# Patient Record
Sex: Female | Born: 1970 | Race: Black or African American | Hispanic: No | Marital: Single
Health system: Southern US, Community
[De-identification: ages and names within clinical notes are randomized; demographics above are authoritative.]

---

## 1998-12-20 ENCOUNTER — Encounter: Payer: Self-pay | Admitting: Family Medicine

## 1998-12-20 ENCOUNTER — Encounter: Admission: RE | Admit: 1998-12-20 | Discharge: 1998-12-20 | Payer: Self-pay | Admitting: Family Medicine

## 1999-01-12 ENCOUNTER — Other Ambulatory Visit: Admission: RE | Admit: 1999-01-12 | Discharge: 1999-01-12 | Payer: Self-pay | Admitting: General Surgery

## 1999-02-05 ENCOUNTER — Encounter: Admission: RE | Admit: 1999-02-05 | Discharge: 1999-02-05 | Payer: Self-pay | Admitting: General Surgery

## 1999-02-05 ENCOUNTER — Encounter: Payer: Self-pay | Admitting: General Surgery

## 1999-03-14 ENCOUNTER — Encounter: Payer: Self-pay | Admitting: General Surgery

## 1999-03-14 ENCOUNTER — Encounter (INDEPENDENT_AMBULATORY_CARE_PROVIDER_SITE_OTHER): Payer: Self-pay | Admitting: Specialist

## 1999-03-14 ENCOUNTER — Ambulatory Visit (HOSPITAL_COMMUNITY): Admission: RE | Admit: 1999-03-14 | Discharge: 1999-03-14 | Payer: Self-pay | Admitting: General Surgery

## 1999-09-12 ENCOUNTER — Encounter (INDEPENDENT_AMBULATORY_CARE_PROVIDER_SITE_OTHER): Payer: Self-pay | Admitting: Specialist

## 1999-09-12 ENCOUNTER — Ambulatory Visit (HOSPITAL_COMMUNITY): Admission: RE | Admit: 1999-09-12 | Discharge: 1999-09-13 | Payer: Self-pay | Admitting: Otolaryngology

## 2000-09-30 ENCOUNTER — Other Ambulatory Visit: Admission: RE | Admit: 2000-09-30 | Discharge: 2000-09-30 | Payer: Self-pay | Admitting: *Deleted

## 2001-10-19 ENCOUNTER — Other Ambulatory Visit: Admission: RE | Admit: 2001-10-19 | Discharge: 2001-10-19 | Payer: Self-pay | Admitting: *Deleted

## 2002-08-13 ENCOUNTER — Encounter: Payer: Self-pay | Admitting: Family Medicine

## 2002-08-13 ENCOUNTER — Encounter: Admission: RE | Admit: 2002-08-13 | Discharge: 2002-08-13 | Payer: Self-pay | Admitting: Family Medicine

## 2002-11-02 ENCOUNTER — Other Ambulatory Visit: Admission: RE | Admit: 2002-11-02 | Discharge: 2002-11-02 | Payer: Self-pay | Admitting: *Deleted

## 2003-03-29 ENCOUNTER — Observation Stay (HOSPITAL_COMMUNITY): Admission: AD | Admit: 2003-03-29 | Discharge: 2003-03-29 | Payer: Self-pay | Admitting: *Deleted

## 2003-05-30 ENCOUNTER — Inpatient Hospital Stay (HOSPITAL_COMMUNITY): Admission: AD | Admit: 2003-05-30 | Discharge: 2003-05-30 | Payer: Self-pay | Admitting: Obstetrics and Gynecology

## 2003-11-10 ENCOUNTER — Inpatient Hospital Stay (HOSPITAL_COMMUNITY): Admission: AD | Admit: 2003-11-10 | Discharge: 2003-11-13 | Payer: Self-pay | Admitting: Obstetrics & Gynecology

## 2003-11-12 ENCOUNTER — Encounter (INDEPENDENT_AMBULATORY_CARE_PROVIDER_SITE_OTHER): Payer: Self-pay | Admitting: *Deleted

## 2003-12-29 ENCOUNTER — Other Ambulatory Visit: Admission: RE | Admit: 2003-12-29 | Discharge: 2003-12-29 | Payer: Self-pay | Admitting: Obstetrics and Gynecology

## 2004-05-24 ENCOUNTER — Inpatient Hospital Stay (HOSPITAL_COMMUNITY): Admission: EM | Admit: 2004-05-24 | Discharge: 2004-05-27 | Payer: Self-pay

## 2004-06-06 ENCOUNTER — Ambulatory Visit (HOSPITAL_COMMUNITY): Admission: RE | Admit: 2004-06-06 | Discharge: 2004-06-07 | Payer: Self-pay | Admitting: Otolaryngology

## 2005-04-03 ENCOUNTER — Other Ambulatory Visit: Admission: RE | Admit: 2005-04-03 | Discharge: 2005-04-03 | Payer: Self-pay | Admitting: Obstetrics and Gynecology

## 2008-06-08 ENCOUNTER — Encounter: Admission: RE | Admit: 2008-06-08 | Discharge: 2008-06-08 | Payer: Self-pay | Admitting: Family Medicine

## 2010-07-13 NOTE — Op Note (Signed)
NAME:  Ann Olsen, Ann Olsen                         ACCOUNT NO.:  [id number]   MEDICAL RECORD NO.:  [id number]                   PATIENT TYPE:  INP   LOCATION:  9304                                 FACILITY:  [facility]   PHYSICIAN:  Michelle L. Vincente Poli, M.D.            DATE OF BIRTH:  [birth year]   DATE OF PROCEDURE:  DATE OF DISCHARGE:  11/13/2003                                 OPERATIVE REPORT   PREOPERATIVE DIAGNOSES:  Postpartum and desires permanent sterilization.   POSTOPERATIVE DIAGNOSES:  Postpartum and desires permanent sterilization.   PROCEDURE:  Modified Pomeroy bilateral tubal ligation.   SURGEON:  Michelle L. Vincente Poli, M.D.   ANESTHESIA:  Epidural.   ESTIMATED BLOOD LOSS:  None.   FINDINGS:  Normal fimbriated fallopian tubes.   DESCRIPTION OF PROCEDURE:  The patient was taken to the operating room where  epidural was dosed by anesthesia and found to be adequate.  She was then  prepped and draped in the usual sterile fashion.  Local was placed in the  umbilicus, and a small transverse incision was made in the umbilicus.  The  fascia was then opened easily.  We used the [employer].  We then used  [employer] retractors, identified the fallopian tube on the right side,  followed it to its fimbriated end.  We picked it up using a Babcock clamp in  the mid-portion.  A 3-cm knuckle was tied off using plain gut suture x2, and  that knuckle was excised using Metzenbaum scissors.  In a similar fashion on  the left side, the fallopian tube was directly visualized and followed to  its fimbriated end.  It was picked up in the mid-portion using a Babcock  clamp, and a 3-cm knuckle was tied off using plain gut suture x2.  That  knuckle was excised. Hemostasis was noted.  Each fallopian tube segment was  sent in a separate specimen container.  At that point, we then closed the  fascia using a continuous running stitch using 0 Vicryl, and then closed the  skin using a subcuticular stitch  using 3-0 Vicryl.  All sponge, lap, and  instrument counts were correct x2.  The patient tolerated the procedure well  and went to the recovery room in stable condition.      MLG/MEDQ  D:  11/12/2003  T:  11/13/2003  Job:  [id number]

## 2010-07-13 NOTE — Consult Note (Signed)
NAME:  Ann Olsen, Ann Olsen               ACCOUNT NO.:  [id number]   MEDICAL RECORD NO.:  [id number]          PATIENT TYPE:  INP   LOCATION:  2302                         FACILITY:  [facility]   PHYSICIAN:  Zola Button T. Lazarus Salines, M.D. DATE OF BIRTH:  June 23, 1970   DATE OF CONSULTATION:  05/24/2004  DATE OF DISCHARGE:                                   CONSULTATION   CHIEF COMPLAINT:  Facial trauma.   HISTORY OF PRESENT ILLNESS:  This  40 year old black female was cleaning the  attic earlier today, took a misstep and apparently fell between the rafters  through the sheet rock approximately 15 feet down into the garage. She  struck some stored items and eventually wound up on the floor. The  suggestion was that she in fact lost consciousness at the scene. She  attempted to get in the house by banging on the door and finally her husband  heard her. She was noted to be bleeding from the left nose at the scene. She  was brought to the emergency room with complaints of headache, pain in the  left face, pain in the left arm. She has undergone significant evaluation  with the emergency room doctors and with the trauma surgeons. Specific to  the ENT inquiry, she has a fracture of the left zygoma including a depressed  fracture of the arch and a comminuted displaced sagittal fracture of the  lateral orbital rim and wall. There appears to be some bony impingement on  the orbital apex but not specifically impinging on the optic nerve or  causing extraocular muscle dysfunction. She reports decent vision in both  eyes. She has mild pain with range of motion. She also has some pain  attempting to open and shut her mouth but feels like her teeth fit together  normally. She denies numbness of her lip. She had some bleeding from the  nose which has spontaneously stopped. She feels like she is oriented at the  present time. No hearing difficulty. No neck pain. No change in voice. She  is not drooling or bleeding from the  mouth.   PHYSICAL EXAMINATION:  GENERAL:  This is a quiet adult black female.  Generally, she seems to hold her own in conversation but makes an occasional  comment that sounds like she may be still slightly confused.  HEENT:  She has a superficial scrape over the left cheek. She has ecchymotic  swelling around the left orbit. The lids are swollen shut but relatively  easily open without great tension. She has mild proptosis of the left eye.  She has a large subconjunctival hemorrhage on the lateral conjunctive.  According Dr. Allen Norris, the tension of the globe feels intact. Pupils  are equal, round and reactive to light. Cranial nerves intact with the  possible exception of slight reduced upward gaze left eye only. Ear canals  are clear with normal aerated drums and no hemotympanum. Anterior nose has  some bloody crusting in the left side and has leftward septal deviation  which is probably old. Oral cavity is moist with teeth in good repair and  mild trismus will with approximate 1.5 cm inter incisal opening anteriorly.  Oropharynx is clear. Could not examine nasopharynx or hypopharynx.  NECK: Without adenopathy or tenderness. On palpating the bony facial  contours, she is tender along the infraorbital rim but no obvious step-offs.  She is very tender along the lateral canthus and lateral orbital rim region.  She has depression of the left zygomatic arch. No other bony facial  tenderness.   IMPRESSION:  Left zygoma fracture including a depressed fracture of the left  arch with impingement on the temporalis muscle and coronoid process. A  severe comminuted almost sagittal split type fracture of the lateral orbital  wall with medial displacement approaching the orbital apex and narrowing of  that structure but without apparent optic nerve impingement or extraocular  muscle dysfunction. She does have mild trismus. She has a lateral  subconjunctival hemorrhage.   PLAN:  Dr. Allen Norris, ophthalmologist, does not feel the eye is acutely  compromised. Therefore, we will wait for her mental status to clear. I will  recommend ice, elevation, frequent visual checks, and amoxicillin or similar  to prevent infection from the sinus fractures, and no nose blowing for two  full weeks. She will need an open reduction internal fixation of the zygoma  next week with a special attention to the lateral orbital wall and the  zygomatic arch. I have discussed this with the patient. I will follow her  along in the hospital. Dr. Allen Norris would like her on moxifloxacin eye  drops which I have ordered. consultation on Maxie Debose  [phone] Dr. will be dictated on the March13, placed my office, Dr. Fernande Bras copy to the trauma service copy to the charts copy Dr. Read Drivers  thanks      KTW/MEDQ  D:  05/24/2004  T:  05/24/2004  Job:  [id number]   cc:   Allen Norris, M.D.  [address], [address]  [place]  [place] [address]  Fax: [phone]   Paula Libra, MD  Fax: [phone]

## 2010-07-13 NOTE — Discharge Summary (Signed)
NAME:  Ann Olsen, Ann Olsen               ACCOUNT NO.:  [id number]   MEDICAL RECORD NO.:  [id number]          PATIENT TYPE:  INP   LOCATION:  5004                         FACILITY:  [facility]   PHYSICIAN:  Gabrielle Dare. Janee Morn, M.D.DATE OF BIRTH:  Mar 09, 1970   DATE OF ADMISSION:  05/24/2004  DATE OF DISCHARGE:  05/27/2004                                 DISCHARGE SUMMARY   ADMITTING TRAUMA SURGEON:  Violeta Gelinas, M.D.   CONSULTANTS:  Zola Button T. Lazarus Salines, M.D. ENT, Stefani Dama, M.D. of  neurosurgery,  and Allen Norris, M.D. of ophthalmology.   DISCHARGE DIAGNOSES:  1.  Status post fall through an attic floor.  2.  Closed head injury with small subdural hematoma, small right posterior      parietal small contusion.  3.  Multiple left facial fractures of the orbit, maxillary sinus, and      zygoma.  4.  Left eye periorbital swelling.   BRIEF HISTORY ON ADMISSION:  This is a 40 year old black female who was  trying to retrieve some boxes from her attic when she apparently fell  through the attic flooring into the garage below. She fell onto the concrete  floor. There was loss of consciousness, brief in duration. She presented  complaining of left facial, head, and shoulder pain. She was alert and  oriented and had a Glasgow coma scale of 15 on admission, but she was  amnesic to the event. Workup at this time including CT scan of the head  showed a small left subdural hematoma and right posterior parietal small  contusion. CT scan of the cervical spine was negative for acute fracture or  other abnormality. Maxillofacial CT scan showed left orbital maxillary sinus  and zygoma comminuted fractures, and left humerus x-ray was negative for  fracture. Chest x-ray was questionable for a small left pulmonary contusion.   The patient was admitted for observation and pain control. She was seen in  consultation by Dr. Danielle Dess neurosurgery. She again was monitored in the ICU  and underwent follow-up CT  scan the day following admission which showed  stable small subdural. No formal neurosurgical follow-up was indicated. She  underwent evaluation by Dr. Lazarus Salines for multiple facial fractures and it was  recommended she be maintained on amoxicillin for 10 days for prophylaxis.  She also underwent evaluation by Dr. Laurell Josephs for possible left eye injury.  Again, she did have left orbital fractures but extraocular movements  appeared intact and there appeared to be no injury itself to the eye. She  was covered with antibiotic eye drops and was mobilized. She was doing well,  ambulating, and tolerating regular diet, and was able to be discharged home  in stable, improved condition 05/27/2004 with her family.   DISCHARGE MEDICATIONS:  1.  Percocet 1-2 p.o. q. 4-6 hours p.r.n. pain.  2.  Cipro eye drops 1 drop OS 4 times daily until all taken.   FOLLOWUP:  She is to follow up with Dr. Lazarus Salines next week. Follow up with  trauma clinic as needed. Follow up with Dr. Dione Booze as needed.  SR/MEDQ  D:  08/03/2004  T:  08/03/2004  Job:  [id number]

## 2010-07-13 NOTE — Op Note (Signed)
[place]  Patient:    Ann Olsen, Ann Olsen                        MRN: [id number] Proc. Date: 09/12/99 Adm. Date:  [id number] Disc. Date: [id number] Attending:  Serena Colonel H CC:         Adolph Pollack, M.D.             Doreatha Lew, M.D.             Gretta Arab Valentina Lucks, M.D.                           Operative Report  PREOPERATIVE DIAGNOSIS:  Right thyroid mass, left neck mass.  POSTOPERATIVE DIAGNOSIS:  Right thyroid mass, left neck mass, suspicious for vagal schwannoma.  OPERATION PERFORMED: 1. Right hemithyroidectomy. 2. Excision of left vagal schwannoma.  SURGEON:  Jefry H. Pollyann Kennedy, M.D.  ASSISTANT:  Gloris Manchester. Lazarus Salines, M.D.  ANESTHESIA:  General endotracheal.  COMPLICATIONS:  None.  FINDINGS:  A 2 cm cystic dark mass involving the right superior pole of the thyroid gland, a smooth encapsulated firm mass that seemed to be within the sheath of the left vagal nerve about 1 cm above the clavicle.  FROZEN SECTION DIAGNOSIS: 1. 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 2. Left neck mass consistent with schwannoma.  REFERRING PHYSICIAN:  Adolph Pollack, M.D.  The patient tolerated the procedure well, was awakened, extubated and transferred to recovery in stable condition.  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  The risks, benefits, alternatives and complications of the procedure were explained to the patient, who seemed to understand and agreed to surgery.  DESCRIPTION OF PROCEDURE:  The patient was taken to the operating room and placed on the operating table in the supine position.  Following induction of general endotracheal anesthesia, the neck was prepped and draped in a standard fashion.  A shoulder  roll was placed beneath the shoulder blades to hyperextend the neck.  A transverse incision was outlined with a marking pen on the low anterior neck and was incised with a #10 scalpel.  Electrocautery was then used to dissect through the fascia and the platysma muscle.  A subplatysmal flap was developed superiorly up to the thyroid cartilage and inferiorly to the clavicle.  A thyroid retractor was then used to retract the flaps.  1 - Right hemithyroidectomy.  The midline fascia was divided and careful dissection was accomplished around the anterolateral surface of the right lobe of the thyroid.  The superior pole was reflected inferiorly carefully ligating the vascular pedicle superiorly.  Careful dissection staying right on the capsules and accomplished around the entire lobe of the gland.  What appeared to be a parathyroid gland inferiorly was dissected free, keeping its vascular supply intact.  Careful dissection around the posterior aspect of the gland revealed the ligament of Allyson Sabal which was sharply divided up along the face of the trachea.  The recurrent laryngeal nerve was identified and the fibrofatty tissue deep to this in the tracheoesophageal groove.  This was left unmolested.  The isthmus was divided using electrocautery and the right lobe was sent for pathologic evaluation with frozen section.  Silk ties and bipolar  electrocautery were used as needed for hemostasis of the thyroid bed on the right side. Palpation of the left thyroid gland did not reveal any palpable masses.  2 - Excision of left vagal schwannoma.  After the right thyroid lobe was removed, the attention was then turned towards the left side of the neck.  The isthmus was followed over to the left thyroid lobe which was free of any palpable masses.  Dissection was then accomplished inferior to the thyroid lobe, which is where the mass was seen on preop CT scan.  Careful dissection revealed the inferior thyroid  artery on the left side that was left intact. The dissection was lateral to the inferior thyroid artery and medial and just superficial to the carotid artery.  Within the fibrofatty tissue deep to the sternocleidomastoid muscle the mass became evident and was palpated.  Careful dissection through fibrous tissue was accomplished using electrocautery and sharp dissection and the capsule of the mass was identified.  The entire mass was mobilized around the circumference using sharp dissection.  It seemed to have an attachment superiorly and inferiorly and careful dissection of these attachments revealed what was felt to be the vagus nerve.  On the posterior aspect of the mass, there were three distinct 1 to 2 mm nerve fibers that were clearly visible.  These were sharply dissected free of the underlying mass. The mass was completely delivered from the capsule and was smooth and ovoid in shape.  The remainder of the nerve was kept intact.  The mass was sent for frozen section evaluation.  The wound was irrigated with saline and hemostasis was completed using silk ties and electrocautery as needed.  The wound was closed in layers using 3-0 chromic on the strap muscles fascia and the platysma.  The skin was closed with a running 5-0 nylon suture.  A #7 [place] round J-P drain was left in the wound and exited through the left side of the incision.  The patient was then awakened from anesthesia, extubated and transferred to recovery in stable condition. DD:  09/12/99 TD:  09/14/99 Job: [id number]

## 2010-07-13 NOTE — Op Note (Signed)
NAME:  Ann Olsen, Ann Olsen               ACCOUNT NO.:  [id number]   MEDICAL RECORD NO.:  [id number]          PATIENT TYPE:  OIB   LOCATION:  5707                         FACILITY:  [facility]   PHYSICIAN:  Zola Button T. Lazarus Salines, M.D. DATE OF BIRTH:  26-Dec-1970   DATE OF PROCEDURE:  06/06/2004  DATE OF DISCHARGE:                                 OPERATIVE REPORT   PREOPERATIVE DIAGNOSIS:  Depressed left zygoma fracture with depressed arch  and comminuted lateral orbital wall fracture.   POSTOPERATIVE DIAGNOSIS:  Depressed left zygoma fracture with depressed arch  and comminuted lateral orbital wall fracture.   PROCEDURE PERFORMED:  Open reduction and internal fixation, left zygomatic  fracture, with orbital wall exploration and Frost stitch.   SURGEON:  Gloris Manchester. Lazarus Salines, M.D.   ANESTHESIA:  General orotracheal.   BLOOD LOSS:  Minimal.   COMPLICATIONS:  None.   FINDINGS:  A badly depressed zygomatic arch fracture with some mechanical  trismus readily improved with forcible mechanical distraction of the jaw.  A  mobile zygoma after elevation with a joker elevator.  comminuted lateral  orbital wall fracture with several pieces impinging medially towards the  orbital apex.  No step-off or displacement of the infraorbital rim.   PROCEDURE:  With the patient in a comfortable supine position, general  orotracheal anesthesia was induced without difficulty.  At an appropriate  level, the patient was placed in a slight sitting position.  The right eye  was taped shut.  The left eye was lubricated and then closed with a  6-0  Ethilon Frost stitch in the standard fashion.  1% Xylocaine with 1:100,000  epinephrine, 8 mL total, was infiltrated into the lower lid and into the  lateral brow for surgical hemostasis.  Several minutes were allowed for this  to take effect.  A sterile preparation and draping of the eye region and  midface was accomplished.   The x-rays were reviewed in the operating room.   The zygoma was palpated with the findings as described above.  Note that at  the time of intubation, she had limited jaw motion.  I was able to grasp the  mandible and with gentle traction, loosen some obvious fibrous adhesions and  allow her to have a more full range of motion, allowing an intubation.  After palpating the fractures, a lateral brow incision was sharply executed  approximately 3 cm in length and carried down through skin, orbicularis  oculi muscle, and to the periosteum of the orbital rim.  This was done  directly over the fracture line.  The periosteum was elevated back towards  the temporal fossa and into the orbit across the fracture line.  The  findings were as described above.   A joker elevator was dissected back behind the body of the zygoma and with  the anterolateral pressure, the zygoma was mobilized and the arch was  elevated and was stable in its elevated position.  There was a diastasis at  the frontozygomatic suture where it was fractured.   Working into the orbit, the periosteum was readily elevated and carried back  along  the fractured segments into the orbit without difficulty.  There were  several mobile fragments laterally which seemed to be too big to fit into  their original location.  Working more medially. there was a prominent  fragment as visualized on x-ray impinging on the orbital apex.  This was  partially reduced.  Several loose fragments were removed.  With distraction  of the zygoma, a more anterior piece of the lateral orbital wall was noted  to fit into position.  No further attention to the lateral orbital wall was  felt indicated.  The periosteum was intact with no orbital fat visualized at  the close of this portion of the procedure.   At this point, an 8-hole curved, locking 2-mm plate from the Leibinger set  was placed along the frontozygomatic suture and bent slightly.  It was  secured to the stable frontal bone with several 5-mm  screws.  The body of  the zygoma was mobilized again and the lower portion of the plate was bent  slightly for anterior traction on the body the zygoma and this was also  secured with non-locking screws initially to allow it to have some anterior  traction and then with locking screws to secure the plate.  Good  approximation at the frontozygomatic suture and contour of the lateral  orbital rim was noted.  The body and arch were in good position.  There was  no significant mobility.   Working down into the orbit once again, the fragments were as left.  A piece  of Gelfilm was dipped and then cut approximately 2 x 2.5 cm and this was  placed between the periorbita and the bone fractures, towards the orbital  apex.   The wound was thoroughly irrigated and suctioned clear.  The periosteum was  closed with interrupted 4-0 chromic sutures, as was the orbicularis oculi  muscle.  Finally, the skin was closed with a running simple 5-0 Ethilon.  Maxitrol ointment was applied on the wound.  The Frost stitch was removed  without difficulty.  The eye was irrigated with 30 mL of balanced salt  solution.  At this point, the procedure was completed.  The patient was  returned to Anesthesia, awakened, extubated, and transferred to recovery in  stable condition.   COMMENT:  [age]-year-old black female sustained a fall almost 2 weeks  ago with a complex fracture of the zygoma, depression of the zygomatic arch,  and a comminuted fracture of the lateral orbital wall was apparent  impingement on the orbital apex, hence the indication for today's procedure.  Anticipate a routine postoperative recovery with attention to ice,  elevation, analgesia, antibiosis, visual checks.  We will let her go home in  the morning if she is doing nicely.      KTW/MEDQ  D:  06/06/2004  T:  06/07/2004  Job:  [id number]

## 2011-07-16 ENCOUNTER — Other Ambulatory Visit: Payer: Self-pay | Admitting: Obstetrics and Gynecology

## 2012-07-16 ENCOUNTER — Other Ambulatory Visit: Payer: Self-pay | Admitting: Obstetrics and Gynecology

## 2013-03-02 ENCOUNTER — Telehealth: Payer: Self-pay | Admitting: *Deleted

## 2013-09-01 ENCOUNTER — Other Ambulatory Visit: Payer: Self-pay | Admitting: Obstetrics and Gynecology

## 2013-09-03 LAB — CYTOLOGY - PAP

## 2014-09-20 ENCOUNTER — Other Ambulatory Visit: Payer: Self-pay | Admitting: Obstetrics and Gynecology

## 2014-09-21 LAB — CYTOLOGY - PAP

## 2015-10-26 DIAGNOSIS — Z6824 Body mass index (BMI) 24.0-24.9, adult: Secondary | ICD-10-CM | POA: Diagnosis not present

## 2015-10-26 DIAGNOSIS — Z01419 Encounter for gynecological examination (general) (routine) without abnormal findings: Secondary | ICD-10-CM | POA: Diagnosis not present

## 2015-10-26 DIAGNOSIS — Z1231 Encounter for screening mammogram for malignant neoplasm of breast: Secondary | ICD-10-CM | POA: Diagnosis not present

## 2016-02-01 DIAGNOSIS — G43009 Migraine without aura, not intractable, without status migrainosus: Secondary | ICD-10-CM | POA: Diagnosis not present

## 2016-02-01 DIAGNOSIS — Z0000 Encounter for general adult medical examination without abnormal findings: Secondary | ICD-10-CM | POA: Diagnosis not present

## 2016-02-01 DIAGNOSIS — J309 Allergic rhinitis, unspecified: Secondary | ICD-10-CM | POA: Diagnosis not present

## 2016-02-01 DIAGNOSIS — E041 Nontoxic single thyroid nodule: Secondary | ICD-10-CM | POA: Diagnosis not present

## 2016-02-21 DIAGNOSIS — N644 Mastodynia: Secondary | ICD-10-CM | POA: Diagnosis not present

## 2016-11-13 DIAGNOSIS — Z1212 Encounter for screening for malignant neoplasm of rectum: Secondary | ICD-10-CM | POA: Diagnosis not present

## 2016-11-13 DIAGNOSIS — Z1231 Encounter for screening mammogram for malignant neoplasm of breast: Secondary | ICD-10-CM | POA: Diagnosis not present

## 2016-11-13 DIAGNOSIS — Z6824 Body mass index (BMI) 24.0-24.9, adult: Secondary | ICD-10-CM | POA: Diagnosis not present

## 2016-11-13 DIAGNOSIS — Z01419 Encounter for gynecological examination (general) (routine) without abnormal findings: Secondary | ICD-10-CM | POA: Diagnosis not present

## 2017-02-13 DIAGNOSIS — Z136 Encounter for screening for cardiovascular disorders: Secondary | ICD-10-CM | POA: Diagnosis not present

## 2017-02-13 DIAGNOSIS — D649 Anemia, unspecified: Secondary | ICD-10-CM | POA: Diagnosis not present

## 2017-02-13 DIAGNOSIS — Z131 Encounter for screening for diabetes mellitus: Secondary | ICD-10-CM | POA: Diagnosis not present

## 2017-02-13 DIAGNOSIS — Z0000 Encounter for general adult medical examination without abnormal findings: Secondary | ICD-10-CM | POA: Diagnosis not present

## 2017-04-01 DIAGNOSIS — D649 Anemia, unspecified: Secondary | ICD-10-CM | POA: Diagnosis not present

## 2017-04-16 ENCOUNTER — Other Ambulatory Visit (HOSPITAL_COMMUNITY): Payer: Self-pay | Admitting: *Deleted

## 2017-04-17 ENCOUNTER — Encounter (HOSPITAL_COMMUNITY)
Admission: RE | Admit: 2017-04-17 | Discharge: 2017-04-17 | Disposition: A | Discharge status: Home / Self Care | Payer: BLUE CROSS/BLUE SHIELD | Source: Ambulatory Visit | Attending: Obstetrics and Gynecology | Admitting: Obstetrics and Gynecology

## 2017-04-17 DIAGNOSIS — D649 Anemia, unspecified: Secondary | ICD-10-CM | POA: Diagnosis not present

## 2017-04-17 MED ORDER — SODIUM CHLORIDE 0.9 % IV SOLN
750.0000 mg | INTRAVENOUS | Status: DC
  Administered 2017-04-17: 750 mg via INTRAVENOUS
  Filled 2017-04-17: qty 15

## 2017-04-17 NOTE — Discharge Instructions (Signed)
Ferric carboxymaltose injection What is this medicine? FERRIC CARBOXYMALTOSE (ferr-ik car-box-ee-mol-toes) is an iron complex. Iron is used to make healthy red blood cells, which carry oxygen and nutrients throughout the body. This medicine is used to treat anemia in people with chronic kidney disease or people who cannot take iron by mouth. This medicine may be used for other purposes; ask your health care provider or pharmacist if you have questions. COMMON BRAND NAME(S): Injectafer What should I tell my health care provider before I take this medicine? They need to know if you have any of these conditions: -anemia not caused by low iron levels -high levels of iron in the blood -liver disease -an unusual or allergic reaction to iron, other medicines, foods, dyes, or preservatives -pregnant or trying to get pregnant -breast-feeding How should I use this medicine? This medicine is for infusion into a vein. It is given by a health care professional in a hospital or clinic setting. Talk to your pediatrician regarding the use of this medicine in children. Special care may be needed. Overdosage: If you think you have taken too much of this medicine contact a poison control center or emergency room at once. NOTE: This medicine is only for you. Do not share this medicine with others. What if I miss a dose? It is important not to miss your dose. Call your doctor or health care professional if you are unable to keep an appointment. What may interact with this medicine? Do not take this medicine with any of the following medications: -deferoxamine -dimercaprol -other iron products This medicine may also interact with the following medications: -chloramphenicol -deferasirox This list may not describe all possible interactions. Give your health care provider a list of all the medicines, herbs, non-prescription drugs, or dietary supplements you use. Also tell them if you smoke, drink alcohol, or use  illegal drugs. Some items may interact with your medicine. What should I watch for while using this medicine? Visit your doctor or health care professional regularly. Tell your doctor if your symptoms do not start to get better or if they get worse. You may need blood work done while you are taking this medicine. You may need to follow a special diet. Talk to your doctor. Foods that contain iron include: whole grains/cereals, dried fruits, beans, or peas, leafy green vegetables, and organ meats (liver, kidney). What side effects may I notice from receiving this medicine? Side effects that you should report to your doctor or health care professional as soon as possible: -allergic reactions like skin rash, itching or hives, swelling of the face, lips, or tongue -breathing problems -changes in blood pressure -feeling faint or lightheaded, falls -flushing, sweating, or hot feelings Side effects that usually do not require medical attention (report to your doctor or health care professional if they continue or are bothersome): -changes in taste -constipation -dizziness -headache -nausea -pain, redness, or irritation at site where injected -vomiting This list may not describe all possible side effects. Call your doctor for medical advice about side effects. You may report side effects to FDA at 1-800-FDA-1088. Where should I keep my medicine? This drug is given in a hospital or clinic and will not be stored at home. NOTE: This sheet is a summary. It may not cover all possible information. If you have questions about this medicine, talk to your doctor, pharmacist, or health care provider.  2018 [employer] (2015-03-16 11:20:47)  

## 2017-04-24 ENCOUNTER — Encounter (HOSPITAL_COMMUNITY)
Admission: RE | Admit: 2017-04-24 | Discharge: 2017-04-24 | Disposition: A | Discharge status: Home / Self Care | Payer: BLUE CROSS/BLUE SHIELD | Source: Ambulatory Visit | Attending: Obstetrics and Gynecology | Admitting: Obstetrics and Gynecology

## 2017-04-24 DIAGNOSIS — D649 Anemia, unspecified: Secondary | ICD-10-CM | POA: Diagnosis not present

## 2017-04-24 MED ORDER — SODIUM CHLORIDE 0.9 % IV SOLN
750.0000 mg | INTRAVENOUS | Status: DC
  Administered 2017-04-24: 750 mg via INTRAVENOUS
  Filled 2017-04-24: qty 15

## 2017-09-11 DIAGNOSIS — S80861A Insect bite (nonvenomous), right lower leg, initial encounter: Secondary | ICD-10-CM | POA: Diagnosis not present

## 2017-11-14 DIAGNOSIS — Z01419 Encounter for gynecological examination (general) (routine) without abnormal findings: Secondary | ICD-10-CM | POA: Diagnosis not present

## 2017-11-14 DIAGNOSIS — Z6824 Body mass index (BMI) 24.0-24.9, adult: Secondary | ICD-10-CM | POA: Diagnosis not present

## 2017-11-14 DIAGNOSIS — Z1231 Encounter for screening mammogram for malignant neoplasm of breast: Secondary | ICD-10-CM | POA: Diagnosis not present

## 2018-03-02 DIAGNOSIS — E041 Nontoxic single thyroid nodule: Secondary | ICD-10-CM | POA: Diagnosis not present

## 2018-03-02 DIAGNOSIS — Z131 Encounter for screening for diabetes mellitus: Secondary | ICD-10-CM | POA: Diagnosis not present

## 2018-03-02 DIAGNOSIS — Z13220 Encounter for screening for lipoid disorders: Secondary | ICD-10-CM | POA: Diagnosis not present

## 2018-03-02 DIAGNOSIS — Z0000 Encounter for general adult medical examination without abnormal findings: Secondary | ICD-10-CM | POA: Diagnosis not present

## 2018-07-17 DIAGNOSIS — K649 Unspecified hemorrhoids: Secondary | ICD-10-CM | POA: Diagnosis not present

## 2018-10-07 DIAGNOSIS — R87615 Unsatisfactory cytologic smear of cervix: Secondary | ICD-10-CM | POA: Diagnosis not present

## 2019-01-29 ENCOUNTER — Other Ambulatory Visit: Payer: Self-pay | Admitting: Obstetrics and Gynecology

## 2019-01-29 DIAGNOSIS — N644 Mastodynia: Secondary | ICD-10-CM

## 2019-01-29 DIAGNOSIS — Z01419 Encounter for gynecological examination (general) (routine) without abnormal findings: Secondary | ICD-10-CM | POA: Diagnosis not present

## 2019-01-29 DIAGNOSIS — Z6823 Body mass index (BMI) 23.0-23.9, adult: Secondary | ICD-10-CM | POA: Diagnosis not present

## 2019-01-29 DIAGNOSIS — D259 Leiomyoma of uterus, unspecified: Secondary | ICD-10-CM | POA: Diagnosis not present

## 2019-01-29 DIAGNOSIS — N760 Acute vaginitis: Secondary | ICD-10-CM | POA: Diagnosis not present

## 2019-02-12 ENCOUNTER — Ambulatory Visit
Admission: RE | Admit: 2019-02-12 | Discharge: 2019-02-12 | Disposition: A | Discharge status: Home / Self Care | Payer: BLUE CROSS/BLUE SHIELD | Source: Ambulatory Visit | Attending: Obstetrics and Gynecology | Admitting: Obstetrics and Gynecology

## 2019-02-12 ENCOUNTER — Other Ambulatory Visit: Payer: Self-pay

## 2019-02-12 ENCOUNTER — Ambulatory Visit: Admission: RE | Admit: 2019-02-12 | Payer: BLUE CROSS/BLUE SHIELD | Source: Ambulatory Visit

## 2019-02-12 DIAGNOSIS — R922 Inconclusive mammogram: Secondary | ICD-10-CM | POA: Diagnosis not present

## 2019-02-12 DIAGNOSIS — N644 Mastodynia: Secondary | ICD-10-CM

## 2019-03-12 DIAGNOSIS — D649 Anemia, unspecified: Secondary | ICD-10-CM | POA: Diagnosis not present

## 2019-03-12 DIAGNOSIS — Z23 Encounter for immunization: Secondary | ICD-10-CM | POA: Diagnosis not present

## 2019-03-12 DIAGNOSIS — Z13220 Encounter for screening for lipoid disorders: Secondary | ICD-10-CM | POA: Diagnosis not present

## 2019-03-12 DIAGNOSIS — E041 Nontoxic single thyroid nodule: Secondary | ICD-10-CM | POA: Diagnosis not present

## 2019-03-12 DIAGNOSIS — Z0000 Encounter for general adult medical examination without abnormal findings: Secondary | ICD-10-CM | POA: Diagnosis not present

## 2019-09-02 DIAGNOSIS — R21 Rash and other nonspecific skin eruption: Secondary | ICD-10-CM | POA: Diagnosis not present

## 2019-09-02 DIAGNOSIS — J309 Allergic rhinitis, unspecified: Secondary | ICD-10-CM | POA: Diagnosis not present

## 2019-11-26 DIAGNOSIS — M25511 Pain in right shoulder: Secondary | ICD-10-CM | POA: Diagnosis not present

## 2019-12-08 DIAGNOSIS — M7501 Adhesive capsulitis of right shoulder: Secondary | ICD-10-CM | POA: Diagnosis not present

## 2019-12-20 DIAGNOSIS — M25611 Stiffness of right shoulder, not elsewhere classified: Secondary | ICD-10-CM | POA: Diagnosis not present

## 2019-12-20 DIAGNOSIS — M7501 Adhesive capsulitis of right shoulder: Secondary | ICD-10-CM | POA: Diagnosis not present

## 2019-12-20 DIAGNOSIS — M6281 Muscle weakness (generalized): Secondary | ICD-10-CM | POA: Diagnosis not present

## 2019-12-20 DIAGNOSIS — M25511 Pain in right shoulder: Secondary | ICD-10-CM | POA: Diagnosis not present

## 2019-12-29 DIAGNOSIS — M25511 Pain in right shoulder: Secondary | ICD-10-CM | POA: Diagnosis not present

## 2019-12-29 DIAGNOSIS — M7501 Adhesive capsulitis of right shoulder: Secondary | ICD-10-CM | POA: Diagnosis not present

## 2019-12-29 DIAGNOSIS — M6281 Muscle weakness (generalized): Secondary | ICD-10-CM | POA: Diagnosis not present

## 2019-12-29 DIAGNOSIS — M25611 Stiffness of right shoulder, not elsewhere classified: Secondary | ICD-10-CM | POA: Diagnosis not present

## 2020-01-07 DIAGNOSIS — M25611 Stiffness of right shoulder, not elsewhere classified: Secondary | ICD-10-CM | POA: Diagnosis not present

## 2020-01-07 DIAGNOSIS — M25511 Pain in right shoulder: Secondary | ICD-10-CM | POA: Diagnosis not present

## 2020-01-07 DIAGNOSIS — M7501 Adhesive capsulitis of right shoulder: Secondary | ICD-10-CM | POA: Diagnosis not present

## 2020-01-07 DIAGNOSIS — M6281 Muscle weakness (generalized): Secondary | ICD-10-CM | POA: Diagnosis not present

## 2020-01-10 DIAGNOSIS — M7501 Adhesive capsulitis of right shoulder: Secondary | ICD-10-CM | POA: Diagnosis not present

## 2020-01-10 DIAGNOSIS — M6281 Muscle weakness (generalized): Secondary | ICD-10-CM | POA: Diagnosis not present

## 2020-01-10 DIAGNOSIS — M25611 Stiffness of right shoulder, not elsewhere classified: Secondary | ICD-10-CM | POA: Diagnosis not present

## 2020-01-10 DIAGNOSIS — M25511 Pain in right shoulder: Secondary | ICD-10-CM | POA: Diagnosis not present

## 2020-01-17 DIAGNOSIS — M7501 Adhesive capsulitis of right shoulder: Secondary | ICD-10-CM | POA: Diagnosis not present

## 2020-01-17 DIAGNOSIS — M25511 Pain in right shoulder: Secondary | ICD-10-CM | POA: Diagnosis not present

## 2020-01-17 DIAGNOSIS — M25611 Stiffness of right shoulder, not elsewhere classified: Secondary | ICD-10-CM | POA: Diagnosis not present

## 2020-01-17 DIAGNOSIS — M6281 Muscle weakness (generalized): Secondary | ICD-10-CM | POA: Diagnosis not present

## 2020-01-24 DIAGNOSIS — M7501 Adhesive capsulitis of right shoulder: Secondary | ICD-10-CM | POA: Diagnosis not present

## 2020-02-08 DIAGNOSIS — Z1231 Encounter for screening mammogram for malignant neoplasm of breast: Secondary | ICD-10-CM | POA: Diagnosis not present

## 2020-02-08 DIAGNOSIS — D649 Anemia, unspecified: Secondary | ICD-10-CM | POA: Diagnosis not present

## 2020-02-08 DIAGNOSIS — Z6824 Body mass index (BMI) 24.0-24.9, adult: Secondary | ICD-10-CM | POA: Diagnosis not present

## 2020-02-08 DIAGNOSIS — Z01419 Encounter for gynecological examination (general) (routine) without abnormal findings: Secondary | ICD-10-CM | POA: Diagnosis not present

## 2020-03-17 DIAGNOSIS — Z13220 Encounter for screening for lipoid disorders: Secondary | ICD-10-CM | POA: Diagnosis not present

## 2020-03-17 DIAGNOSIS — Z131 Encounter for screening for diabetes mellitus: Secondary | ICD-10-CM | POA: Diagnosis not present

## 2020-03-17 DIAGNOSIS — D649 Anemia, unspecified: Secondary | ICD-10-CM | POA: Diagnosis not present

## 2020-03-17 DIAGNOSIS — Z0000 Encounter for general adult medical examination without abnormal findings: Secondary | ICD-10-CM | POA: Diagnosis not present

## 2020-03-17 DIAGNOSIS — E041 Nontoxic single thyroid nodule: Secondary | ICD-10-CM | POA: Diagnosis not present

## 2020-04-10 DIAGNOSIS — M7501 Adhesive capsulitis of right shoulder: Secondary | ICD-10-CM | POA: Diagnosis not present

## 2020-04-10 DIAGNOSIS — G8918 Other acute postprocedural pain: Secondary | ICD-10-CM | POA: Diagnosis not present

## 2020-04-12 DIAGNOSIS — M25611 Stiffness of right shoulder, not elsewhere classified: Secondary | ICD-10-CM | POA: Diagnosis not present

## 2020-04-12 DIAGNOSIS — M7501 Adhesive capsulitis of right shoulder: Secondary | ICD-10-CM | POA: Diagnosis not present

## 2020-04-12 DIAGNOSIS — M6281 Muscle weakness (generalized): Secondary | ICD-10-CM | POA: Diagnosis not present

## 2020-04-12 DIAGNOSIS — M25511 Pain in right shoulder: Secondary | ICD-10-CM | POA: Diagnosis not present

## 2020-04-14 DIAGNOSIS — M7501 Adhesive capsulitis of right shoulder: Secondary | ICD-10-CM | POA: Diagnosis not present

## 2020-04-14 DIAGNOSIS — M25511 Pain in right shoulder: Secondary | ICD-10-CM | POA: Diagnosis not present

## 2020-04-14 DIAGNOSIS — M6281 Muscle weakness (generalized): Secondary | ICD-10-CM | POA: Diagnosis not present

## 2020-04-14 DIAGNOSIS — M25611 Stiffness of right shoulder, not elsewhere classified: Secondary | ICD-10-CM | POA: Diagnosis not present

## 2020-04-17 DIAGNOSIS — M7501 Adhesive capsulitis of right shoulder: Secondary | ICD-10-CM | POA: Diagnosis not present

## 2020-04-17 DIAGNOSIS — M6281 Muscle weakness (generalized): Secondary | ICD-10-CM | POA: Diagnosis not present

## 2020-04-17 DIAGNOSIS — M25511 Pain in right shoulder: Secondary | ICD-10-CM | POA: Diagnosis not present

## 2020-04-17 DIAGNOSIS — M25611 Stiffness of right shoulder, not elsewhere classified: Secondary | ICD-10-CM | POA: Diagnosis not present

## 2020-04-19 DIAGNOSIS — M7501 Adhesive capsulitis of right shoulder: Secondary | ICD-10-CM | POA: Diagnosis not present

## 2020-04-19 DIAGNOSIS — M25511 Pain in right shoulder: Secondary | ICD-10-CM | POA: Diagnosis not present

## 2020-04-19 DIAGNOSIS — M6281 Muscle weakness (generalized): Secondary | ICD-10-CM | POA: Diagnosis not present

## 2020-04-19 DIAGNOSIS — M25611 Stiffness of right shoulder, not elsewhere classified: Secondary | ICD-10-CM | POA: Diagnosis not present

## 2020-04-24 DIAGNOSIS — M25611 Stiffness of right shoulder, not elsewhere classified: Secondary | ICD-10-CM | POA: Diagnosis not present

## 2020-04-24 DIAGNOSIS — M25511 Pain in right shoulder: Secondary | ICD-10-CM | POA: Diagnosis not present

## 2020-04-24 DIAGNOSIS — M7501 Adhesive capsulitis of right shoulder: Secondary | ICD-10-CM | POA: Diagnosis not present

## 2020-04-24 DIAGNOSIS — M6281 Muscle weakness (generalized): Secondary | ICD-10-CM | POA: Diagnosis not present

## 2020-04-28 DIAGNOSIS — M25611 Stiffness of right shoulder, not elsewhere classified: Secondary | ICD-10-CM | POA: Diagnosis not present

## 2020-04-28 DIAGNOSIS — M25511 Pain in right shoulder: Secondary | ICD-10-CM | POA: Diagnosis not present

## 2020-04-28 DIAGNOSIS — M6281 Muscle weakness (generalized): Secondary | ICD-10-CM | POA: Diagnosis not present

## 2020-04-28 DIAGNOSIS — M7501 Adhesive capsulitis of right shoulder: Secondary | ICD-10-CM | POA: Diagnosis not present

## 2020-05-01 DIAGNOSIS — M25511 Pain in right shoulder: Secondary | ICD-10-CM | POA: Diagnosis not present

## 2020-05-01 DIAGNOSIS — M7501 Adhesive capsulitis of right shoulder: Secondary | ICD-10-CM | POA: Diagnosis not present

## 2020-05-01 DIAGNOSIS — M6281 Muscle weakness (generalized): Secondary | ICD-10-CM | POA: Diagnosis not present

## 2020-05-01 DIAGNOSIS — M25611 Stiffness of right shoulder, not elsewhere classified: Secondary | ICD-10-CM | POA: Diagnosis not present

## 2020-05-05 DIAGNOSIS — M7501 Adhesive capsulitis of right shoulder: Secondary | ICD-10-CM | POA: Diagnosis not present

## 2020-05-05 DIAGNOSIS — M25511 Pain in right shoulder: Secondary | ICD-10-CM | POA: Diagnosis not present

## 2020-05-05 DIAGNOSIS — M6281 Muscle weakness (generalized): Secondary | ICD-10-CM | POA: Diagnosis not present

## 2020-05-05 DIAGNOSIS — M25611 Stiffness of right shoulder, not elsewhere classified: Secondary | ICD-10-CM | POA: Diagnosis not present

## 2020-05-06 IMAGING — MG DIGITAL DIAGNOSTIC BILAT W/ TOMO W/ CAD
8 series · 9 of 24 positions shown · non-contrast
Comparison: Previous exam(s).

CLINICAL DATA: 48-year-old presenting with approximate 2-1/2 week
history of diffuse LOWER LEFT breast pain.Annual evaluation, RIGHT
breast.

EXAM:
DIGITAL DIAGNOSTIC BILATERAL MAMMOGRAM WITH CAD AND TOMO

[R MLO synth-2D]
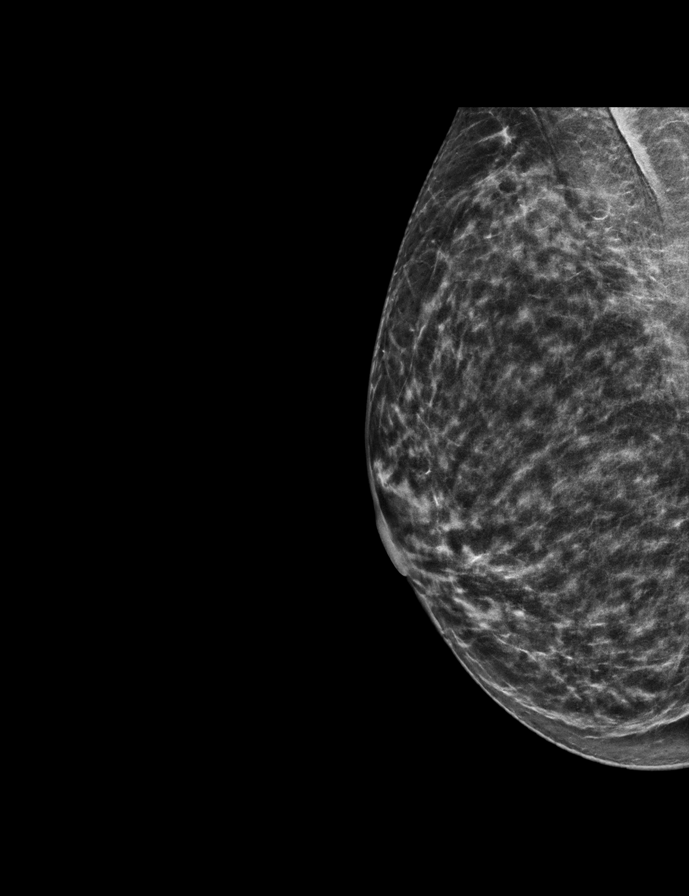

[R CC synth-2D]
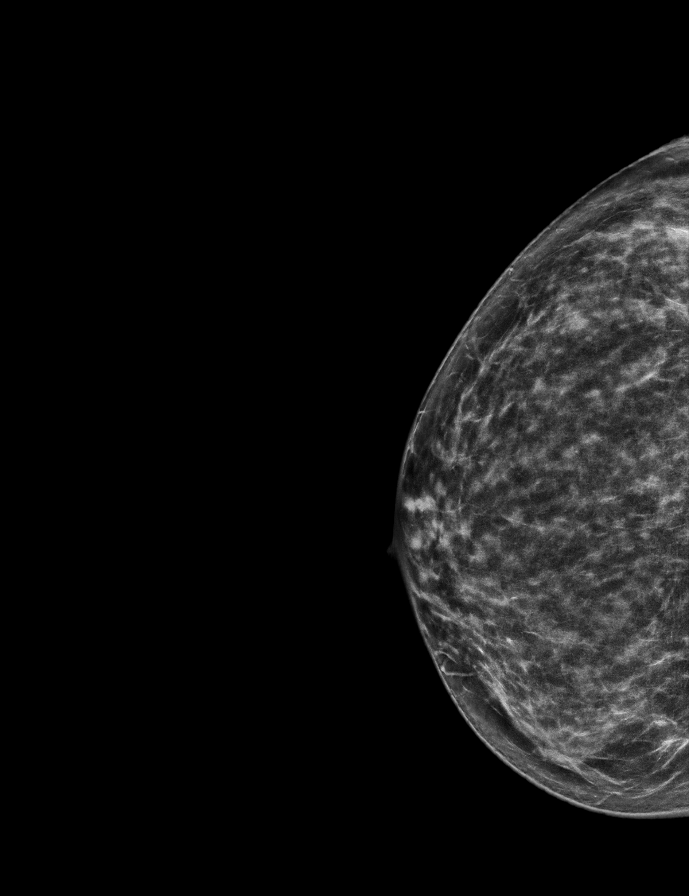

[L MLO synth-2D]
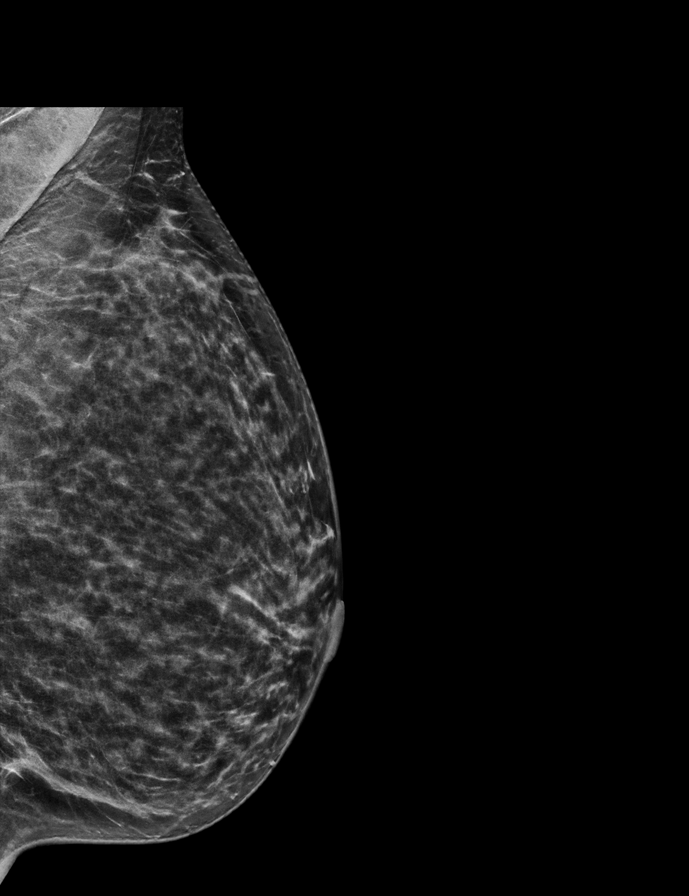

[L CC synth-2D]
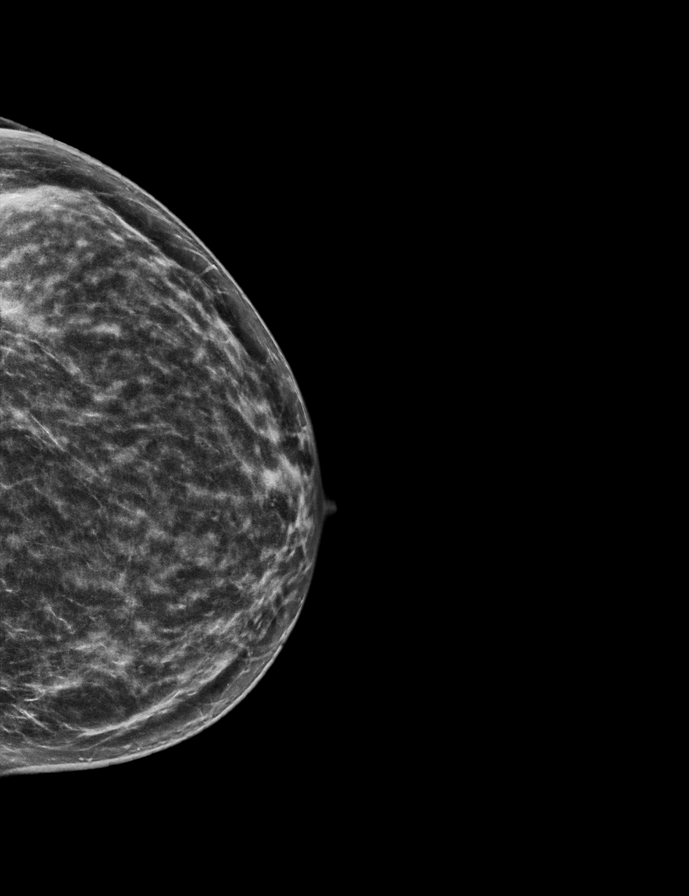

[R MLO tomo · 2 of 58 frames shown]
[frame 19/58]
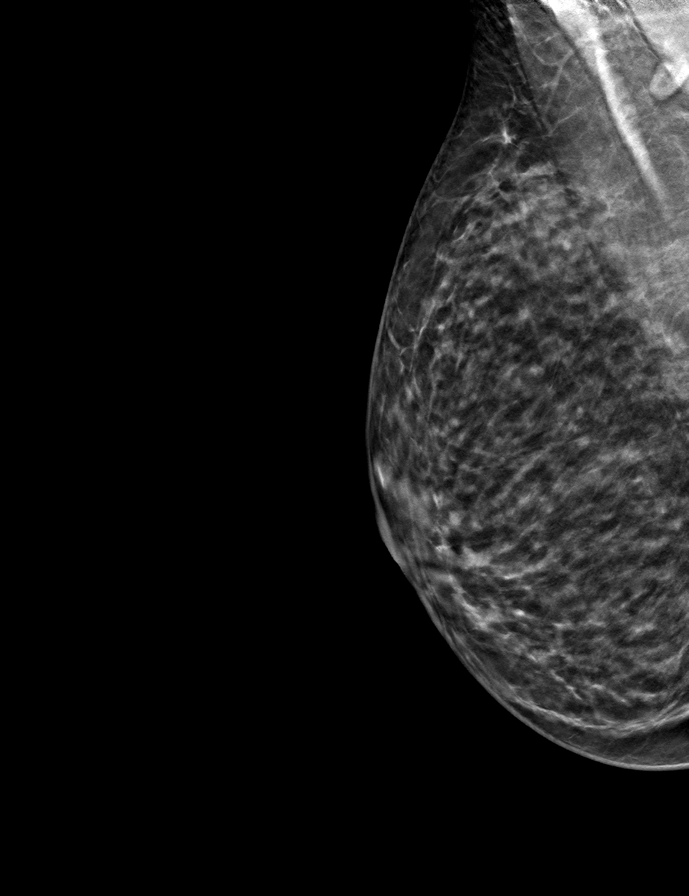
[frame 29/58]
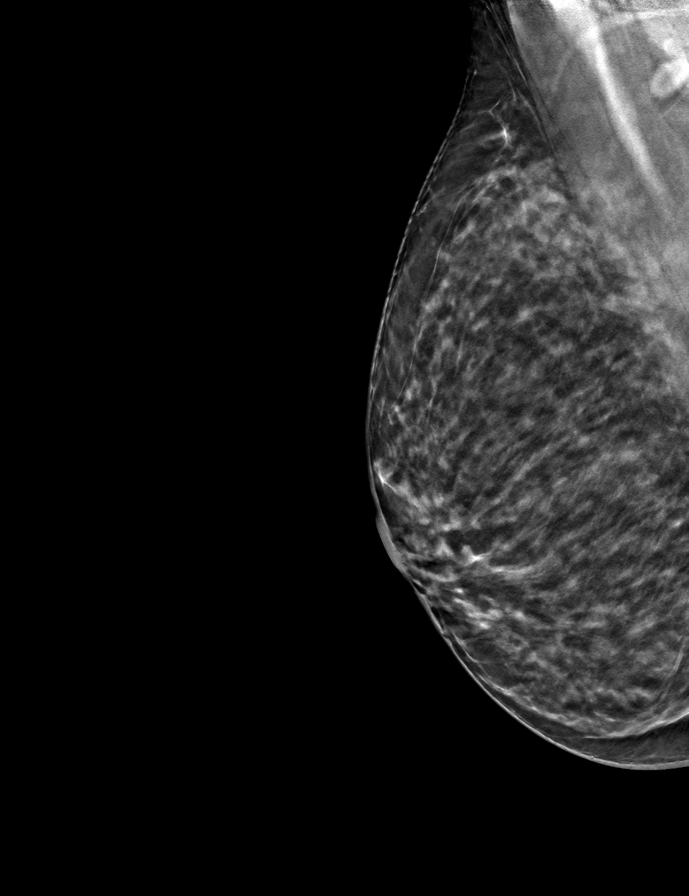

[L CC tomo · tomo slice 28/55.0]
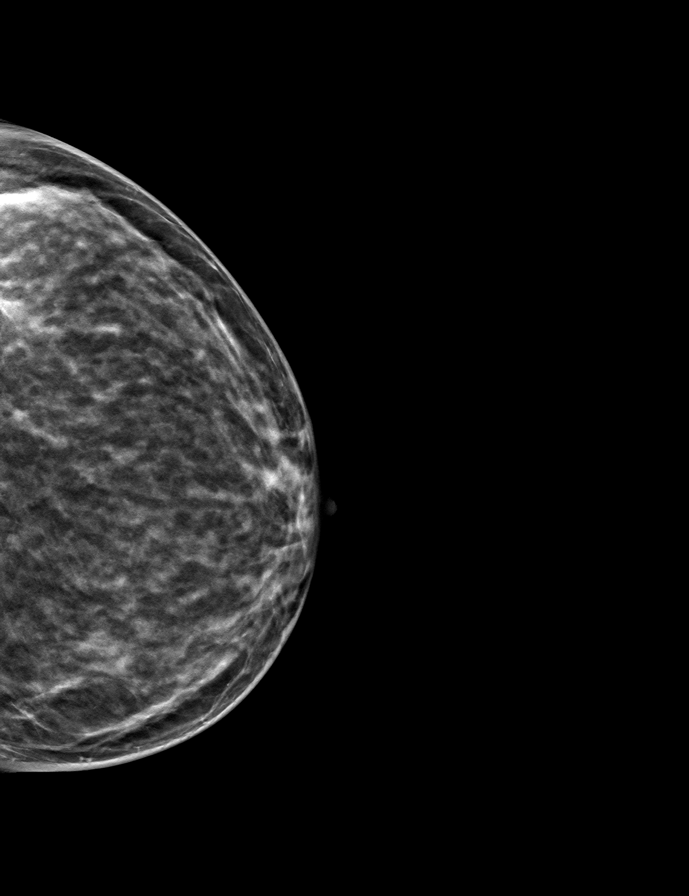

[R CC tomo · tomo slice 31/61.0]
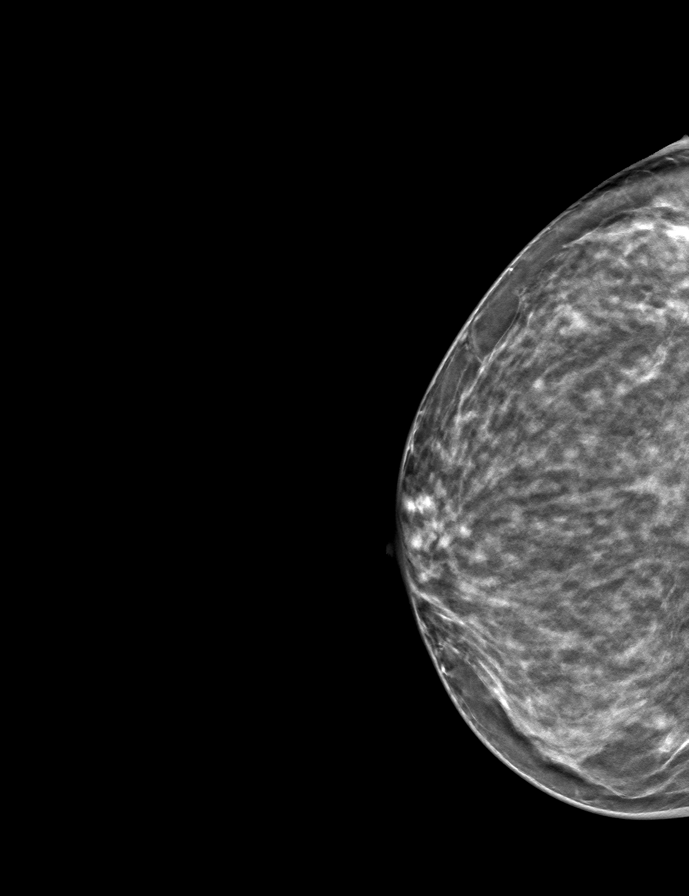

[L MLO tomo · tomo slice 27/54.0]
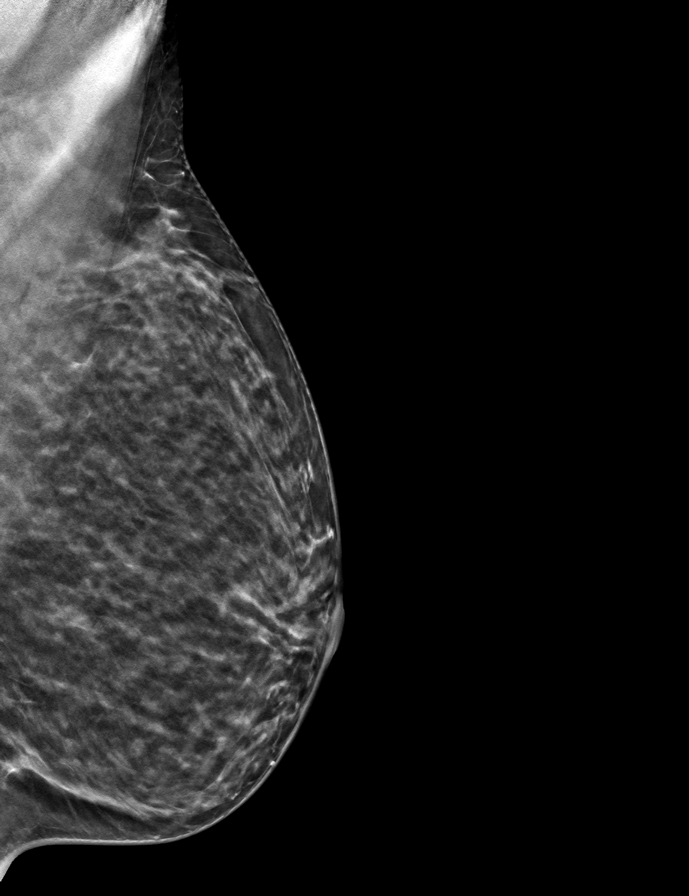

[9 of 24 positions shown; findings below may reference images not displayed]

ACR Breast Density Category c: The breast tissue is heterogeneously
dense, which may obscure small masses.
FINDINGS: Tomosynthesis and synthesized full field CC and MLO views of both
breasts were obtained.

No findings suspicious for malignancy in either breast.
Specifically, no mammographic abnormalities in the LOWER LEFT breast
in the area of diffuse pain.

Mammographic images were processed with CAD.
IMPRESSION: No mammographic evidence of malignancy involving either breast.

RECOMMENDATION:
Screening mammogram in one year.(Code:[id number])

Strategies for alleviating breast pain including decreasing caffeine
intake, vitamin-E supplementation, and avoidance of tight
compression brassieres, including underwire brassieres, were
discussed with the patient.

I have discussed the findings and recommendations with the patient.
If applicable, a reminder letter will be sent to the patient
regarding the next appointment.

BI-RADS CATEGORY  1: Negative.

## 2020-05-08 DIAGNOSIS — M25511 Pain in right shoulder: Secondary | ICD-10-CM | POA: Diagnosis not present

## 2020-05-08 DIAGNOSIS — M25611 Stiffness of right shoulder, not elsewhere classified: Secondary | ICD-10-CM | POA: Diagnosis not present

## 2020-05-08 DIAGNOSIS — M7501 Adhesive capsulitis of right shoulder: Secondary | ICD-10-CM | POA: Diagnosis not present

## 2020-05-08 DIAGNOSIS — M6281 Muscle weakness (generalized): Secondary | ICD-10-CM | POA: Diagnosis not present

## 2020-05-10 DIAGNOSIS — M6281 Muscle weakness (generalized): Secondary | ICD-10-CM | POA: Diagnosis not present

## 2020-05-10 DIAGNOSIS — M25611 Stiffness of right shoulder, not elsewhere classified: Secondary | ICD-10-CM | POA: Diagnosis not present

## 2020-05-10 DIAGNOSIS — M25511 Pain in right shoulder: Secondary | ICD-10-CM | POA: Diagnosis not present

## 2020-05-10 DIAGNOSIS — M7501 Adhesive capsulitis of right shoulder: Secondary | ICD-10-CM | POA: Diagnosis not present

## 2020-05-15 DIAGNOSIS — M25611 Stiffness of right shoulder, not elsewhere classified: Secondary | ICD-10-CM | POA: Diagnosis not present

## 2020-05-15 DIAGNOSIS — M6281 Muscle weakness (generalized): Secondary | ICD-10-CM | POA: Diagnosis not present

## 2020-05-15 DIAGNOSIS — M25511 Pain in right shoulder: Secondary | ICD-10-CM | POA: Diagnosis not present

## 2020-05-15 DIAGNOSIS — M7501 Adhesive capsulitis of right shoulder: Secondary | ICD-10-CM | POA: Diagnosis not present

## 2020-05-17 DIAGNOSIS — M7501 Adhesive capsulitis of right shoulder: Secondary | ICD-10-CM | POA: Diagnosis not present

## 2020-05-17 DIAGNOSIS — M25511 Pain in right shoulder: Secondary | ICD-10-CM | POA: Diagnosis not present

## 2020-05-17 DIAGNOSIS — M25611 Stiffness of right shoulder, not elsewhere classified: Secondary | ICD-10-CM | POA: Diagnosis not present

## 2020-05-17 DIAGNOSIS — M6281 Muscle weakness (generalized): Secondary | ICD-10-CM | POA: Diagnosis not present

## 2020-05-19 DIAGNOSIS — D509 Iron deficiency anemia, unspecified: Secondary | ICD-10-CM | POA: Diagnosis not present

## 2020-08-18 DIAGNOSIS — R059 Cough, unspecified: Secondary | ICD-10-CM | POA: Diagnosis not present

## 2020-08-18 DIAGNOSIS — Z20822 Contact with and (suspected) exposure to covid-19: Secondary | ICD-10-CM | POA: Diagnosis not present

## 2020-08-18 DIAGNOSIS — Z03818 Encounter for observation for suspected exposure to other biological agents ruled out: Secondary | ICD-10-CM | POA: Diagnosis not present

## 2020-10-23 DIAGNOSIS — K644 Residual hemorrhoidal skin tags: Secondary | ICD-10-CM | POA: Diagnosis not present

## 2020-10-23 DIAGNOSIS — Z1211 Encounter for screening for malignant neoplasm of colon: Secondary | ICD-10-CM | POA: Diagnosis not present

## 2020-10-23 DIAGNOSIS — K648 Other hemorrhoids: Secondary | ICD-10-CM | POA: Diagnosis not present

## 2021-03-12 DIAGNOSIS — Z6823 Body mass index (BMI) 23.0-23.9, adult: Secondary | ICD-10-CM | POA: Diagnosis not present

## 2021-03-12 DIAGNOSIS — Z1231 Encounter for screening mammogram for malignant neoplasm of breast: Secondary | ICD-10-CM | POA: Diagnosis not present

## 2021-03-12 DIAGNOSIS — Z01419 Encounter for gynecological examination (general) (routine) without abnormal findings: Secondary | ICD-10-CM | POA: Diagnosis not present

## 2021-03-22 DIAGNOSIS — Z13220 Encounter for screening for lipoid disorders: Secondary | ICD-10-CM | POA: Diagnosis not present

## 2021-03-22 DIAGNOSIS — Z131 Encounter for screening for diabetes mellitus: Secondary | ICD-10-CM | POA: Diagnosis not present

## 2021-03-22 DIAGNOSIS — E041 Nontoxic single thyroid nodule: Secondary | ICD-10-CM | POA: Diagnosis not present

## 2021-03-22 DIAGNOSIS — Z0000 Encounter for general adult medical examination without abnormal findings: Secondary | ICD-10-CM | POA: Diagnosis not present

## 2021-03-22 DIAGNOSIS — D509 Iron deficiency anemia, unspecified: Secondary | ICD-10-CM | POA: Diagnosis not present

## 2022-02-09 DIAGNOSIS — M546 Pain in thoracic spine: Secondary | ICD-10-CM | POA: Diagnosis not present

## 2022-02-09 DIAGNOSIS — M62830 Muscle spasm of back: Secondary | ICD-10-CM | POA: Diagnosis not present

## 2022-03-25 DIAGNOSIS — E041 Nontoxic single thyroid nodule: Secondary | ICD-10-CM | POA: Diagnosis not present

## 2022-03-25 DIAGNOSIS — D509 Iron deficiency anemia, unspecified: Secondary | ICD-10-CM | POA: Diagnosis not present

## 2022-03-25 DIAGNOSIS — Z0000 Encounter for general adult medical examination without abnormal findings: Secondary | ICD-10-CM | POA: Diagnosis not present

## 2022-03-25 DIAGNOSIS — Z13220 Encounter for screening for lipoid disorders: Secondary | ICD-10-CM | POA: Diagnosis not present

## 2022-03-25 DIAGNOSIS — G43009 Migraine without aura, not intractable, without status migrainosus: Secondary | ICD-10-CM | POA: Diagnosis not present

## 2022-03-27 DIAGNOSIS — Z6823 Body mass index (BMI) 23.0-23.9, adult: Secondary | ICD-10-CM | POA: Diagnosis not present

## 2022-03-27 DIAGNOSIS — N760 Acute vaginitis: Secondary | ICD-10-CM | POA: Diagnosis not present

## 2022-03-27 DIAGNOSIS — Z1231 Encounter for screening mammogram for malignant neoplasm of breast: Secondary | ICD-10-CM | POA: Diagnosis not present

## 2022-03-27 DIAGNOSIS — Z01419 Encounter for gynecological examination (general) (routine) without abnormal findings: Secondary | ICD-10-CM | POA: Diagnosis not present

## 2023-03-31 DIAGNOSIS — E041 Nontoxic single thyroid nodule: Secondary | ICD-10-CM | POA: Diagnosis not present

## 2023-03-31 DIAGNOSIS — D649 Anemia, unspecified: Secondary | ICD-10-CM | POA: Diagnosis not present

## 2023-03-31 DIAGNOSIS — Z0000 Encounter for general adult medical examination without abnormal findings: Secondary | ICD-10-CM | POA: Diagnosis not present

## 2023-03-31 DIAGNOSIS — Z13220 Encounter for screening for lipoid disorders: Secondary | ICD-10-CM | POA: Diagnosis not present

## 2023-03-31 DIAGNOSIS — D72819 Decreased white blood cell count, unspecified: Secondary | ICD-10-CM | POA: Diagnosis not present

## 2023-04-02 DIAGNOSIS — Z1231 Encounter for screening mammogram for malignant neoplasm of breast: Secondary | ICD-10-CM | POA: Diagnosis not present

## 2023-04-02 DIAGNOSIS — Z01419 Encounter for gynecological examination (general) (routine) without abnormal findings: Secondary | ICD-10-CM | POA: Diagnosis not present

## 2023-04-02 DIAGNOSIS — Z124 Encounter for screening for malignant neoplasm of cervix: Secondary | ICD-10-CM | POA: Diagnosis not present

## 2023-04-02 DIAGNOSIS — Z6824 Body mass index (BMI) 24.0-24.9, adult: Secondary | ICD-10-CM | POA: Diagnosis not present
